# Patient Record
Sex: Male | Born: 1950 | Race: White | Hispanic: No | Marital: Married | Smoking: Current every day smoker
Health system: Southern US, Community
[De-identification: ages and names within clinical notes are randomized; demographics above are authoritative.]

## PROBLEM LIST (undated history)

## (undated) DIAGNOSIS — I10 Essential (primary) hypertension: Secondary | ICD-10-CM

## (undated) DIAGNOSIS — E785 Hyperlipidemia, unspecified: Secondary | ICD-10-CM

## (undated) DIAGNOSIS — E119 Type 2 diabetes mellitus without complications: Secondary | ICD-10-CM

## (undated) DIAGNOSIS — N529 Male erectile dysfunction, unspecified: Secondary | ICD-10-CM

## (undated) HISTORY — DX: Essential (primary) hypertension: I10

## (undated) HISTORY — DX: Hyperlipidemia, unspecified: E78.5

## (undated) HISTORY — PX: CATARACT EXTRACTION, BILATERAL: SHX1313

## (undated) HISTORY — PX: FACIAL RECONSTRUCTION SURGERY: SHX631

## (undated) HISTORY — DX: Type 2 diabetes mellitus without complications: E11.9

## (undated) HISTORY — DX: Male erectile dysfunction, unspecified: N52.9

---

## 2000-05-06 ENCOUNTER — Encounter: Admission: RE | Admit: 2000-05-06 | Discharge: 2000-05-06 | Payer: Self-pay | Admitting: Family Medicine

## 2000-05-06 ENCOUNTER — Encounter: Payer: Self-pay | Admitting: Family Medicine

## 2000-05-14 ENCOUNTER — Encounter: Payer: Self-pay | Admitting: Family Medicine

## 2000-05-14 ENCOUNTER — Encounter: Admission: RE | Admit: 2000-05-14 | Discharge: 2000-05-14 | Payer: Self-pay | Admitting: Family Medicine

## 2013-02-08 ENCOUNTER — Other Ambulatory Visit: Payer: Self-pay | Admitting: Family Medicine

## 2013-02-08 DIAGNOSIS — Z0000 Encounter for general adult medical examination without abnormal findings: Secondary | ICD-10-CM

## 2013-02-11 ENCOUNTER — Ambulatory Visit
Admission: RE | Admit: 2013-02-11 | Discharge: 2013-02-11 | Disposition: A | Discharge status: Home / Self Care | Payer: Medicare Other | Source: Ambulatory Visit | Attending: Family Medicine | Admitting: Family Medicine

## 2013-02-11 DIAGNOSIS — Z0000 Encounter for general adult medical examination without abnormal findings: Secondary | ICD-10-CM

## 2014-03-24 ENCOUNTER — Encounter: Payer: Medicare Other | Attending: Family Medicine

## 2014-03-24 VITALS — Ht 71.0 in | Wt 224.4 lb

## 2014-03-24 DIAGNOSIS — Z713 Dietary counseling and surveillance: Secondary | ICD-10-CM | POA: Insufficient documentation

## 2014-03-24 DIAGNOSIS — E119 Type 2 diabetes mellitus without complications: Secondary | ICD-10-CM | POA: Insufficient documentation

## 2014-03-29 NOTE — Progress Notes (Signed)
Patient was seen on 03/24/14 for the first of a series of three diabetes self-management courses at the [facility].  Patient Education Plan per assessed needs and concerns is to attend four course education program for Diabetes Self Management Education.  The following learning objectives were met by the patient during this class:  Describe diabetes  State some common risk factors for diabetes  Defines the role of glucose and insulin  Identifies type of diabetes and pathophysiology  Describe the relationship between diabetes and cardiovascular risk  State the members of the Healthcare Team  States the rationale for glucose monitoring  State when to test glucose  State their individual Target Range  State the importance of logging glucose readings  Describe how to interpret glucose readings  Identifies A1C target  Explain the correlation between A1c and eAG values  State symptoms and treatment of high blood glucose  State symptoms and treatment of low blood glucose  Explain proper technique for glucose testing  Identifies proper sharps disposal  Handouts given during class include:  Living Well with Diabetes book  Carb Counting and Meal Planning book  Meal Plan Card  Carbohydrate guide  Meal planning worksheet  Low Sodium Flavoring Tips  The diabetes portion plate  V7D to eAG Conversion Chart  Diabetes Medications  Diabetes Recommended Care Schedule  Support Group  Diabetes Success Plan  Core Class Satisfaction Survey  Blood Glucose Monitoring Instruction Meter Provided: Yes  If Yes, Brand:  One Touch Ultra Mini Self Monitoring Kit Lot #: [id number] Expiration Date:11/2014    Intervention:    Explained rationale of testing BG to obtain data as to how their diabetes is being managed.  Provided Target Ranges for both pre and post meals  Explained factors that effect BG including food (carbohydrate), stress,  activity level and insulin availability in the body including diabetes medications  Taught patient techniques for using BG monitor and lancing device  Discussed need for Rx for strips and lancets   Explained rationale of recording BG both for patient and MD to assess patterns as needed.  Follow-Up Plan:  Attend core 2

## 2014-03-31 DIAGNOSIS — E119 Type 2 diabetes mellitus without complications: Secondary | ICD-10-CM | POA: Diagnosis not present

## 2014-03-31 NOTE — Progress Notes (Signed)

## 2014-04-07 DIAGNOSIS — E119 Type 2 diabetes mellitus without complications: Secondary | ICD-10-CM | POA: Diagnosis not present

## 2014-04-07 NOTE — Progress Notes (Signed)
Patient was seen on 04/07/14 for the third of a series of three diabetes self-management courses at the [facility]. The following learning objectives were met by the patient during this class:  . State the amount of activity recommended for healthy living . Describe activities suitable for individual needs . Identify ways to regularly incorporate activity into daily life . Identify barriers to activity and ways to over come these barriers  Identify diabetes medications being personally used and their primary action for lowering glucose and possible side effects . Describe role of stress on blood glucose and develop strategies to address psychosocial issues . Identify diabetes complications and ways to prevent them  Explain how to manage diabetes during illness . Evaluate success in meeting personal goal . Establish 2-3 goals that they will plan to diligently work on until they return for the  21-monthfollow-up visit  Goals:   I will be active 30 minutes or more 5 times a week  I will test my glucose at least 1 times a day, 7 days a week  I will compare my readings weekly  Your patient has identified these potential barriers to change:  Finances  Your patient has identified their diabetes self-care support plan as  [facility] Plan:  Attend Core 4 in 4 months

## 2014-08-10 ENCOUNTER — Encounter: Payer: Medicare Other | Attending: Family Medicine

## 2014-08-10 VITALS — Wt 213.4 lb

## 2014-08-10 DIAGNOSIS — Z713 Dietary counseling and surveillance: Secondary | ICD-10-CM | POA: Diagnosis not present

## 2014-08-10 DIAGNOSIS — E119 Type 2 diabetes mellitus without complications: Secondary | ICD-10-CM | POA: Insufficient documentation

## 2014-08-10 NOTE — Progress Notes (Signed)
Appt start time: 0900 end time:  1000.  Patient was seen on 08/10/2014 for a review of the series of three diabetes self-management courses at the [facility]. The following learning objectives were met by the patient during this class:  . Reviewed blood glucose monitoring and interpretation including the recommended target ranges and Hgb A1c.  . Reviewed on carb counting, importance of regularly scheduled meals/snacks, and meal planning.  . Reviewed the effects of physical activity on glucose levels and long-term glucose control.  Recommended goal of 150 minutes of physical activity/week. . Reviewed patient medications and discussed role of medication on blood glucose and possible side effects. . Discussed strategies to manage stress, psychosocial issues, and other obstacles to diabetes management. . Encouraged moderate weight reduction to improve glucose levels.   . Reviewed short-term complications: hyper- and hypo-glycemia.  Discussed causes, symptoms, and treatment options. . Reviewed prevention, detection, and treatment of long-term complications.  Discussed the role of prolonged elevated glucose levels on body systems.  Goals:  Follow Diabetes Meal Plan as instructed  Eat 3 meals and 2 snacks, every 3-5 hrs  Limit carbohydrate intake to 45 grams carbohydrate/meal Limit carbohydrate intake to 15 grams carbohydrate/snack Add lean protein foods to meals/snacks  Monitor glucose levels as instructed by your doctor  Aim for goal of 15-30 mins of physical activity daily as tolerated  Bring food record and glucose log to your next nutrition visit 

## 2017-06-05 ENCOUNTER — Encounter: Payer: Self-pay | Admitting: Neurology

## 2017-06-10 NOTE — Progress Notes (Signed)
Subjective:   Randy Dominguez was seen in consultation in the movement disorder clinic at the request of Lupita Raider, MD.  This patient is accompanied in the office by his spouse who supplements the history.The evaluation is for tremor.  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   There is a family hx of tremor in his mother and maternal GM.    Affected by caffeine:  No. (drinks 3 cups coffee/day) Affected by alcohol:  No. (drinks 2-3 beers and a mixed drink only on Tuesday night - bowling night) Affected by stress:  Yes.   (worse when angry) Affected by fatigue:  No. Spills soup if on spoon:  No. Spills glass of liquid if full:  No. Affects ADL's (tying shoes, brushing teeth, etc):  No.  Current/Previously tried tremor medications: no  Current medications that may exacerbate tremor:  n/a  Outside reports reviewed: historical medical records, office notes and referral letter/letters.  No prior neuroimaging of the brain has been completed.  No Known Allergies  Outpatient Encounter Medications as of 06/12/2017  Medication Sig  . aspirin 81 MG tablet Take 81 mg by mouth daily.  Marland Kitchen atorvastatin (LIPITOR) 80 MG tablet Take 80 mg by mouth daily.  . dorzolamide-timolol (COSOPT) 22.3-6.8 MG/ML ophthalmic solution Place 1 drop into both eyes 2 (two) times daily.  . folic acid (FOLVITE) 400 MCG tablet Take 400 mcg by mouth daily.  Marland Kitchen levothyroxine (SYNTHROID, LEVOTHROID) 50 MCG tablet Take 50 mcg by mouth daily before breakfast.  . lisinopril-hydrochlorothiazide (PRINZIDE,ZESTORETIC) 20-12.5 MG per tablet Take 1 tablet by mouth daily.  . Omega-3 Fatty Acids (FISH OIL) 1000 MG CAPS Take 1 capsule by mouth 2 (two) times daily.  . tadalafil (CIALIS) 5 MG tablet Take 5 mg by mouth daily as needed for erectile dysfunction.   No facility-administered  encounter medications on file as of 06/12/2017.     Past Medical History:  Diagnosis Date  . Diabetes mellitus without complication (HCC)   . Erectile dysfunction   . Hyperlipidemia   . Hypertension     Past Surgical History:  Procedure Laterality Date  . CATARACT EXTRACTION, BILATERAL    . FACIAL RECONSTRUCTION SURGERY  age 32    Social History   Socioeconomic History  . Marital status: Married    Spouse name: Not on file  . Number of children: Not on file  . Years of education: Not on file  . Highest education level: Not on file  Occupational History  . Not on file  Social Needs  . Financial resource strain: Not on file  . Food insecurity:    Worry: Not on file    Inability: Not on file  . Transportation needs:    Medical: Not on file    Non-medical: Not on file  Tobacco Use  . Smoking status: Current Every Day Smoker    Packs/day: 1.00  . Smokeless tobacco: Never Used  Substance and Sexual Activity  . Alcohol use: Yes    Alcohol/week: 0.0 oz    Comment: couple beers once a week  . Drug use: Never  . Sexual activity: Not on file  Lifestyle  . Physical activity:    Days per week: Not on file    Minutes per session: Not on file  . Stress: Not on file  Relationships  . Social connections:  Talks on phone: Not on file    Gets together: Not on file    Attends religious service: Not on file    Active member of club or organization: Not on file    Attends meetings of clubs or organizations: Not on file    Relationship status: Not on file  . Intimate partner violence:    Fear of current or ex partner: Not on file    Emotionally abused: Not on file    Physically abused: Not on file    Forced sexual activity: Not on file  Other Topics Concern  . Not on file  Social History Narrative  . Not on file    Family Status  Relation Name Status  . Other  (Not Specified)  . Mother  Alive  . Father  Deceased  . Brother  Alive  . Brother  Alive  . Daughter   Alive  . Daughter  Alive    Review of Systems A complete 10 system ROS was obtained and was negative apart from what is mentioned.   Objective:   VITALS:   Vitals:   06/12/17 0835  BP: 134/80  Pulse: 78  SpO2: 92%  Weight: 214 lb (97.1 kg)  Height:  (1.803 m)   Gen:  Appears stated age and in NAD. HEENT:  Normocephalic, atraumatic. The mucous membranes are moist. The superficial temporal arteries are without ropiness or tenderness. Cardiovascular: Regular rate and rhythm. Lungs: Clear to auscultation bilaterally. Neck: There are no carotid bruits noted bilaterally.  NEUROLOGICAL:  Orientation:  The patient is alert and oriented x 3.  Recent and remote memory are intact.  Attention span and concentration are normal.  Able to name objects and repeat without trouble.  Fund of knowledge is appropriate Cranial nerves: There is good facial symmetry. The pupils are equal round and reactive to light bilaterally. Fundoscopic exam reveals clear disc margins bilaterally. Extraocular muscles are intact and visual fields are full to confrontational testing. Speech is fluent and clear. Soft palate rises symmetrically and there is no tongue deviation. Hearing is intact to conversational tone. Tone: Tone is good throughout. Sensation: Sensation is intact to light touch and pinprick throughout (facial, trunk, extremities). Vibration is intact at the bilateral big toe. There is no extinction with double simultaneous stimulation. There is no sensory dermatomal level identified. Coordination:  The patient has no dysdiadichokinesia or dysmetria. Motor: Strength is 5/5 in the bilateral upper and lower extremities.  Shoulder shrug is equal bilaterally.  There is no pronator drift.  There are no fasciculations noted. DTR's: Deep tendon reflexes are 2-/4 at the bilateral biceps, triceps, brachioradialis, patella and achilles.  Plantar responses are downgoing bilaterally. Gait and Station: The patient  is able to ambulate without difficulty. The patient is able to heel toe walk without any difficulty. The patient is able to ambulate in a tandem fashion. The patient is able to stand in the Romberg position.   MOVEMENT EXAM: Tremor:  There is tremor in the UE, noted most significantly with action.  The right is more significant than the left.  Tremor is noted with Archimedes spirals bilaterally.  The patient can pour water from one glass to another, but he does spill it and tremor is evident.  He does have a mild degree of rest tremor in both hands when significantly distracted.     Labs: Reviewed the patient's lab work from Jun 04, 2016.  Sodium was 141, potassium 3.8, chloride 103, CO2 27, BUN 20  and creatinine 0.96.  TSH was 4.05.  Hemoglobin A1c 6.7.  AST 19, ALT 16, alkaline phosphatase 84.   Assessment/Plan:   1.  Essential Tremor.  -This is evidenced by the symmetrical nature and longstanding hx of gradually getting worse.  We discussed nature and pathophysiology.  We discussed that this can continue to gradually get worse with time.  We discussed that some medications can worsen this, as can caffeine use.  We discussed that patients can have rest tremor (as he does) after having essential tremor for a number of years.  We discussed medication therapy as well as surgical therapy.  He was shown HIPAA compliant videos of patients who have had surgery and he asked many questions about this.  Ultimately, the patient decided to hold off on doing anything, as he is not that bothered by it..    2.  Tobacco abuse  -Currently smoking 1.0 packs/day    - Patient was informed of the dangers of tobacco abuse including stroke, cancer, and MI, as well as benefits of tobacco cessation.  - Patient  willing to quit at this time.  Has a RX for chantix but hasn't picked it up.  Encouraged him to do so.  - Approximately 3 mins were spent counseling patient cessation techniques. We discussed various methods to  help quit smoking, including deciding on a date to quit, joining a support group, pharmacological agents- nicotine gum/patch/lozenges, chantix,   - I will reassess his progress at future visit.  3.  F/u 1 year, sooner should new neuro issues arise.  Much greater than 50% of this visit was spent in counseling and coordinating care.  Total face to face time:  45 min CC:  Lupita Raider, MD

## 2017-06-12 ENCOUNTER — Encounter: Payer: Self-pay | Admitting: Neurology

## 2017-06-12 ENCOUNTER — Ambulatory Visit: Payer: Medicare Other | Admitting: Neurology

## 2017-06-12 VITALS — BP 134/80 | HR 78 | Ht 71.0 in | Wt 214.0 lb

## 2017-06-12 DIAGNOSIS — F17210 Nicotine dependence, cigarettes, uncomplicated: Secondary | ICD-10-CM

## 2017-06-12 DIAGNOSIS — G250 Essential tremor: Secondary | ICD-10-CM

## 2017-06-12 DIAGNOSIS — Z720 Tobacco use: Secondary | ICD-10-CM

## 2018-06-15 ENCOUNTER — Ambulatory Visit: Payer: Medicare Other | Admitting: Neurology

## 2018-07-02 NOTE — Progress Notes (Signed)
Subjective:   Randy Dominguez was seen in follow-up for essential tremor.  He has not wanted any medications for this, but did want to follow here.  I have not seen him in about a year.  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    He has not had any falls.  He denies any new medical problems.    No Known Allergies  Outpatient Encounter Medications as of 07/06/2018  Medication Sig  . aspirin 81 MG tablet Take 81 mg by mouth daily.  Marland Kitchen. atorvastatin (LIPITOR) 80 MG tablet Take 80 mg by mouth daily.  . dorzolamide-timolol (COSOPT) 22.3-6.8 MG/ML ophthalmic solution Place 1 drop into both eyes 2 (two) times daily.  . folic acid (FOLVITE) 400 MCG tablet Take 400 mcg by mouth daily.  Marland Kitchen. levothyroxine (SYNTHROID, LEVOTHROID) 50 MCG tablet Take 50 mcg by mouth daily before breakfast.  . lisinopril-hydrochlorothiazide (PRINZIDE,ZESTORETIC) 20-12.5 MG per tablet Take 1 tablet by mouth daily.  . Omega-3 Fatty Acids (FISH OIL) 1000 MG CAPS Take 1 capsule by mouth 2 (two) times daily.  . tadalafil (CIALIS) 5 MG tablet Take 5 mg by mouth daily as needed for erectile dysfunction.   No facility-administered encounter medications on file as of 07/06/2018.     Past Medical History:  Diagnosis Date  . Diabetes mellitus without complication (HCC)   . Erectile dysfunction   . Hyperlipidemia   . Hypertension     Past Surgical History:  Procedure Laterality Date  . CATARACT EXTRACTION, BILATERAL    . FACIAL RECONSTRUCTION SURGERY  age 89    Social History   Socioeconomic History  . Marital status: Married    Spouse name: Not on file  . Number of children: Not on file  . Years of education: Not on file  . Highest education level: Not on file  Occupational History  . Not on file  Social Needs  . Financial resource strain: Not on file  . Food insecurity    Worry: Not on  file    Inability: Not on file  . Transportation needs    Medical: Not on file    Non-medical: Not on file  Tobacco Use  . Smoking status: Current Every Day Smoker    Packs/day: 1.00  . Smokeless tobacco: Never Used  Substance and Sexual Activity  . Alcohol use: Yes    Alcohol/week: 0.0 standard drinks    Comment: couple beers once a week  . Drug use: Never  . Sexual activity: Not on file  Lifestyle  . Physical activity    Days per week: Not on file    Minutes per session: Not on file  . Stress: Not on file  Relationships  . Social Musicianconnections    Talks on phone: Not on file    Gets together: Not on file    Attends religious service: Not on file    Active member of club or organization: Not on file    Attends meetings of clubs or organizations: Not on file    Relationship status: Not on file  . Intimate partner violence    Fear of current or ex partner: Not on file    Emotionally abused: Not on file    Physically abused: Not on file    Forced sexual activity: Not on file  Other Topics Concern  . Not  on file  Social History Narrative  . Not on file    Family Status  Relation Name Status  . Other  (Not Specified)  . Mother  Alive  . Father  Deceased  . Brother  Alive  . Brother  Alive  . Daughter  Alive  . Daughter  Alive    Review of Systems Review of Systems  Constitutional: Negative.   HENT: Negative.   Eyes: Negative.   Cardiovascular: Negative.   Gastrointestinal: Negative.   Genitourinary: Negative.   Skin: Negative.       Objective:   VITALS:   Vitals:   07/06/18 0835  BP: (!) 142/72  Pulse: 64  Temp: 97.9 F (36.6 C)  SpO2: 97%  Weight: 211 lb 12.8 oz (96.1 kg)  Height: 5\' 10"  (1.778 m)   GEN:  The patient appears stated age and is in NAD. HEENT:  Normocephalic, atraumatic.  The mucous membranes are moist. The superficial temporal arteries are without ropiness or tenderness. CV:  RRR Lungs:  There are diffuse expiratory wheezes  Neck/HEME:  There are no carotid bruits bilaterally.  Neurological examination:  Orientation: The patient is alert and oriented x3. Cranial nerves: There is good facial symmetry. The speech is fluent and clear. Soft palate rises symmetrically and there is no tongue deviation. Hearing is intact to conversational tone. Sensation: Sensation is intact to light touch throughout Motor: Strength is 5/5 in the bilateral upper and lower extremities.   Shoulder shrug is equal and symmetric.  There is no pronator drift.  Movement examination: Tone: There is normal tone in the UE/LE Abnormal movements: There is postural tremor.  The right is more significant than the left.  He has a very mild degree of rest tremor in both hands with distraction. Coordination:  There is no decremation with RAM's, with any form of RAMS, including alternating supination and pronation of the forearm, hand opening and closing, finger taps, heel taps and toe taps. Gait and Station: The patient has no difficulty arising out of a deep-seated chair without the use of the hands. The patient's stride length is good.       Labs: Reviewed the patient's lab work from Jun 12, 2018.  Hemoglobin A1c was 6.5.  Sodium was 140, potassium 3.7, chloride 104, CO2 27, BUN 16, creatinine 0.98, glucose 92.  AST was 15, ALT 13.  TSH was 3.69.  Total cholesterol was 160.   Assessment/Plan:   1.  Essential Tremor.  -This is evidenced by the symmetrical nature and longstanding hx of gradually getting worse.  We discussed nature and pathophysiology.  We discussed that this can continue to gradually get worse with time.  We discussed that some medications can worsen this, as can caffeine use.  We discussed that patients can have rest tremor (as he does) after having essential tremor for a number of years.  We discussed medication therapy as well as surgical therapy.  He decided to try primidone, but I do think that he is trying to satisfy his wife's  desire for tremor control more than his own (he said this).  Will start with 50 mg nightly.  Risks, benefits, side effects and alternative therapies were discussed.  The opportunity to ask questions was given and they were answered to the best of my ability.  The patient expressed understanding and willingness to follow the outlined treatment protocols.  2.  Tobacco abuse  -Currently smoking 1 packs/day    - Patient was informed of the dangers  of tobacco abuse including stroke, cancer, and MI, as well as benefits of tobacco cessation.  - Patient not willing to quit at this time.  - Approximately 3 mins were spent counseling patient cessation techniques. We discussed various methods to help quit smoking, including deciding on a date to quit, joining a support group, pharmacological agents- nicotine gum/patch/lozenges, chantix,   - I will reassess his progress at future visit.   3.  F/u 6 months, sooner should new neuro issues arise.  Much greater than 50% of this visit was spent in counseling and coordinating care.  Total face to face time:  25 min, not including tobacco counseling time  CC:  Mayra Neer, MD

## 2018-07-06 ENCOUNTER — Other Ambulatory Visit: Payer: Self-pay

## 2018-07-06 ENCOUNTER — Ambulatory Visit (INDEPENDENT_AMBULATORY_CARE_PROVIDER_SITE_OTHER): Payer: Medicare Other | Admitting: Neurology

## 2018-07-06 ENCOUNTER — Encounter: Payer: Self-pay | Admitting: Neurology

## 2018-07-06 VITALS — BP 142/72 | HR 64 | Temp 97.9°F | Ht 70.0 in | Wt 211.8 lb

## 2018-07-06 DIAGNOSIS — F17210 Nicotine dependence, cigarettes, uncomplicated: Secondary | ICD-10-CM | POA: Diagnosis not present

## 2018-07-06 DIAGNOSIS — G250 Essential tremor: Secondary | ICD-10-CM

## 2018-07-06 DIAGNOSIS — Z720 Tobacco use: Secondary | ICD-10-CM

## 2018-07-06 MED ORDER — PRIMIDONE 50 MG PO TABS: 50.0000 mg | ORAL_TABLET | Freq: Every day | ORAL | 1 refills | Status: AC

## 2018-07-06 NOTE — Patient Instructions (Addendum)
Take primidone - 50 mg - 1/2 tablet at night for four nights and then increase to 1 tablet at night thereafter.  The physicians and staff at [facility] are committed to providing excellent care. You may receive a survey requesting feedback about your experience at our office. We strive to receive "very good" responses to the survey questions. If you feel that your experience would prevent you from giving the office a "very good " response, please contact our office to try to remedy the situation. We may be reached at [phone]. Thank you for taking the time out of your busy day to complete the survey.

## 2019-02-27 ENCOUNTER — Ambulatory Visit: Payer: Medicare Other | Attending: Internal Medicine

## 2019-02-27 DIAGNOSIS — Z23 Encounter for immunization: Secondary | ICD-10-CM | POA: Insufficient documentation

## 2019-02-27 NOTE — Progress Notes (Signed)
   Covid-19 Vaccination Clinic  Name:  Randy Dominguez    MRN: [id number] DOB: March 06, 1950  02/27/2019  Randy Dominguez was observed post Covid-19 immunization for  15 minutes without incidence. He was provided with Vaccine Information Sheet and instruction to access the V-Safe system.   Randy Dominguez was instructed to call 911 with any severe reactions post vaccine: Marland Kitchen Difficulty breathing  . Swelling of your face and throat  . A fast heartbeat  . A bad rash all over your body  . Dizziness and weakness    Immunizations Administered    Name Date Dose VIS Date Route   Pfizer COVID-19 Vaccine 02/27/2019  8:31 AM 0.3 mL 12/25/2018 Intramuscular   Manufacturer: [employer], [employer]   Lot: [id number]   NDC: [address]-3

## 2019-03-21 ENCOUNTER — Ambulatory Visit: Payer: Medicare Other | Attending: Internal Medicine

## 2019-03-21 DIAGNOSIS — Z23 Encounter for immunization: Secondary | ICD-10-CM | POA: Insufficient documentation

## 2019-03-21 NOTE — Progress Notes (Signed)
   Covid-19 Vaccination Clinic  Name:  Randy Dominguez    MRN: [id number] DOB: [birth year]  03/21/2019  [name] was observed post Covid-19 immunization for 15 minutes without incident. He was provided with Vaccine Information Sheet and instruction to access the V-Safe system.   [name] was instructed to call 911 with any severe reactions post vaccine: Marland Kitchen Difficulty breathing  . Swelling of face and throat  . A fast heartbeat  . A bad rash all over body  . Dizziness and weakness   Immunizations Administered    Name Date Dose VIS Date Route   Pfizer COVID-19 Vaccine 03/21/2019  8:19 AM 0.3 mL 12/25/2018 Intramuscular   Manufacturer: [employer], [employer]   Lot: [id number]   NDC: [address]-7

## 2019-11-13 ENCOUNTER — Other Ambulatory Visit: Payer: Self-pay

## 2019-11-13 ENCOUNTER — Ambulatory Visit: Payer: Medicare Other | Attending: Internal Medicine

## 2019-11-13 DIAGNOSIS — Z23 Encounter for immunization: Secondary | ICD-10-CM

## 2019-11-13 NOTE — Progress Notes (Signed)
   Covid-19 Vaccination Clinic  Name:  Randy Dominguez    MRN: [id number] DOB: Oct 30, 1950  11/13/2019  Mr. Randy Dominguez was observed post Covid-19 immunization for 15 minutes without incident. He was provided with Vaccine Information Sheet and instruction to access the V-Safe system.   Mr. Randy Dominguez was instructed to call 911 with any severe reactions post vaccine: Marland Kitchen Difficulty breathing  . Swelling of face and throat  . A fast heartbeat  . A bad rash all over body  . Dizziness and weakness

## 2020-01-24 DIAGNOSIS — Z1152 Encounter for screening for COVID-19: Secondary | ICD-10-CM | POA: Diagnosis not present

## 2020-01-26 DIAGNOSIS — Z20822 Contact with and (suspected) exposure to covid-19: Secondary | ICD-10-CM | POA: Diagnosis not present

## 2020-02-16 DIAGNOSIS — H40013 Open angle with borderline findings, low risk, bilateral: Secondary | ICD-10-CM | POA: Diagnosis not present

## 2020-04-10 DIAGNOSIS — G5603 Carpal tunnel syndrome, bilateral upper limbs: Secondary | ICD-10-CM | POA: Diagnosis not present

## 2020-06-02 DIAGNOSIS — G5601 Carpal tunnel syndrome, right upper limb: Secondary | ICD-10-CM | POA: Diagnosis not present

## 2020-06-02 DIAGNOSIS — M62531 Muscle wasting and atrophy, not elsewhere classified, right forearm: Secondary | ICD-10-CM | POA: Diagnosis not present

## 2020-06-02 DIAGNOSIS — G8918 Other acute postprocedural pain: Secondary | ICD-10-CM | POA: Diagnosis not present

## 2020-06-15 DIAGNOSIS — G5601 Carpal tunnel syndrome, right upper limb: Secondary | ICD-10-CM | POA: Diagnosis not present

## 2020-07-06 DIAGNOSIS — M25641 Stiffness of right hand, not elsewhere classified: Secondary | ICD-10-CM | POA: Diagnosis not present

## 2020-07-06 DIAGNOSIS — M25631 Stiffness of right wrist, not elsewhere classified: Secondary | ICD-10-CM | POA: Diagnosis not present

## 2020-07-10 DIAGNOSIS — Z0000 Encounter for general adult medical examination without abnormal findings: Secondary | ICD-10-CM | POA: Diagnosis not present

## 2020-07-10 DIAGNOSIS — Z86010 Personal history of colonic polyps: Secondary | ICD-10-CM | POA: Diagnosis not present

## 2020-07-10 DIAGNOSIS — Z23 Encounter for immunization: Secondary | ICD-10-CM | POA: Diagnosis not present

## 2020-07-10 DIAGNOSIS — E039 Hypothyroidism, unspecified: Secondary | ICD-10-CM | POA: Diagnosis not present

## 2020-07-10 DIAGNOSIS — E782 Mixed hyperlipidemia: Secondary | ICD-10-CM | POA: Diagnosis not present

## 2020-07-10 DIAGNOSIS — F17200 Nicotine dependence, unspecified, uncomplicated: Secondary | ICD-10-CM | POA: Diagnosis not present

## 2020-07-10 DIAGNOSIS — I10 Essential (primary) hypertension: Secondary | ICD-10-CM | POA: Diagnosis not present

## 2020-07-10 DIAGNOSIS — E1169 Type 2 diabetes mellitus with other specified complication: Secondary | ICD-10-CM | POA: Diagnosis not present

## 2020-07-11 DIAGNOSIS — H40013 Open angle with borderline findings, low risk, bilateral: Secondary | ICD-10-CM | POA: Diagnosis not present

## 2020-07-11 DIAGNOSIS — H26491 Other secondary cataract, right eye: Secondary | ICD-10-CM | POA: Diagnosis not present

## 2020-07-11 DIAGNOSIS — H353131 Nonexudative age-related macular degeneration, bilateral, early dry stage: Secondary | ICD-10-CM | POA: Diagnosis not present

## 2020-07-11 DIAGNOSIS — E119 Type 2 diabetes mellitus without complications: Secondary | ICD-10-CM | POA: Diagnosis not present

## 2020-07-14 DIAGNOSIS — M25641 Stiffness of right hand, not elsewhere classified: Secondary | ICD-10-CM | POA: Diagnosis not present

## 2020-07-14 DIAGNOSIS — M25631 Stiffness of right wrist, not elsewhere classified: Secondary | ICD-10-CM | POA: Diagnosis not present

## 2020-07-21 DIAGNOSIS — M25641 Stiffness of right hand, not elsewhere classified: Secondary | ICD-10-CM | POA: Diagnosis not present

## 2020-10-28 DIAGNOSIS — Z23 Encounter for immunization: Secondary | ICD-10-CM | POA: Diagnosis not present

## 2020-12-14 DIAGNOSIS — G5603 Carpal tunnel syndrome, bilateral upper limbs: Secondary | ICD-10-CM | POA: Diagnosis not present

## 2020-12-14 DIAGNOSIS — M79642 Pain in left hand: Secondary | ICD-10-CM | POA: Diagnosis not present

## 2020-12-14 DIAGNOSIS — M79641 Pain in right hand: Secondary | ICD-10-CM | POA: Diagnosis not present

## 2020-12-27 DIAGNOSIS — H40023 Open angle with borderline findings, high risk, bilateral: Secondary | ICD-10-CM | POA: Diagnosis not present

## 2021-01-16 DIAGNOSIS — E039 Hypothyroidism, unspecified: Secondary | ICD-10-CM | POA: Diagnosis not present

## 2021-01-16 DIAGNOSIS — I10 Essential (primary) hypertension: Secondary | ICD-10-CM | POA: Diagnosis not present

## 2021-01-16 DIAGNOSIS — E782 Mixed hyperlipidemia: Secondary | ICD-10-CM | POA: Diagnosis not present

## 2021-01-16 DIAGNOSIS — F17200 Nicotine dependence, unspecified, uncomplicated: Secondary | ICD-10-CM | POA: Diagnosis not present

## 2021-01-16 DIAGNOSIS — E1169 Type 2 diabetes mellitus with other specified complication: Secondary | ICD-10-CM | POA: Diagnosis not present

## 2021-02-13 ENCOUNTER — Other Ambulatory Visit: Payer: Self-pay

## 2021-02-13 DIAGNOSIS — Z87891 Personal history of nicotine dependence: Secondary | ICD-10-CM

## 2021-02-13 DIAGNOSIS — F17210 Nicotine dependence, cigarettes, uncomplicated: Secondary | ICD-10-CM

## 2021-02-21 ENCOUNTER — Ambulatory Visit (INDEPENDENT_AMBULATORY_CARE_PROVIDER_SITE_OTHER): Payer: Medicare Other | Admitting: Acute Care

## 2021-02-21 ENCOUNTER — Encounter: Payer: Self-pay | Admitting: Acute Care

## 2021-02-21 ENCOUNTER — Other Ambulatory Visit: Payer: Self-pay

## 2021-02-21 DIAGNOSIS — F17210 Nicotine dependence, cigarettes, uncomplicated: Secondary | ICD-10-CM

## 2021-02-21 NOTE — Patient Instructions (Signed)
Thank you for participating in the [facility]. °It was our pleasure to meet you today. °We will call you with the results of your scan within the next few days. °Your scan will be assigned a Lung RADS category score by the physicians reading the scans.  °This Lung RADS score determines follow up scanning.  °See below for description of categories, and follow up screening recommendations. °We will be in touch to schedule your follow up screening annually or based on recommendations of our providers. °We will fax a copy of your scan results to your Primary Care Physician, or the physician who referred you to the program, to ensure they have the results. °Please call the office if you have any questions or concerns regarding your scanning experience or results.  °Our office number is [phone]. °Please speak with Denise Phelps, RN. She is our Lung Cancer Screening RN. °If she is unavailable when you call, please have the office staff send her a message. She will return your call at her earliest convenience. °Remember, if your scan is normal, we will scan you annually as long as you continue to meet the criteria for the program. (Age 55-77, Current smoker or smoker who has quit within the last 15 years). °If you are a smoker, remember, quitting is the single most powerful action that you can take to decrease your risk of lung cancer and other pulmonary, breathing related problems. °We know quitting is hard, and we are here to help.  °Please let us know if there is anything we can do to help you meet your goal of quitting. °If you are a former smoker, congratulations. We are proud of you! Remain smoke free! °Remember you can refer friends or family members through the number above.  °We will screen them to make sure they meet criteria for the program. °Thank you for helping us take better care of you by participating in Lung Screening. ° °You can receive free nicotine replacement therapy  ( patches, gum or mints) by calling 1-800-QUIT NOW. Please call so we can get you on the path to becoming  a non-smoker. I know it is hard, but you can do this! ° °Lung RADS Categories: ° °Lung RADS 1: no nodules or definitely non-concerning nodules.  °Recommendation is for a repeat annual scan in 12 months. ° °Lung RADS 2:  nodules that are non-concerning in appearance and behavior with a very low likelihood of becoming an active cancer. °Recommendation is for a repeat annual scan in 12 months. ° °Lung RADS 3: nodules that are probably non-concerning , includes nodules with a low likelihood of becoming an active cancer.  Recommendation is for a 6-month repeat screening scan. Often noted after an upper respiratory illness. We will be in touch to make sure you have no questions, and to schedule your 6-month scan. ° °Lung RADS 4 A: nodules with concerning findings, recommendation is most often for a follow up scan in 3 months or additional testing based on our provider's assessment of the scan. We will be in touch to make sure you have no questions and to schedule the recommended 3 month follow up scan. ° °Lung RADS 4 B:  indicates findings that are concerning. We will be in touch with you to schedule additional diagnostic testing based on our provider's  assessment of the scan. ° °Hypnosis for smoking cessation  °[employer] °[phone] ° °Acupuncture for smoking cessation  °[facility] °[phone]  °

## 2021-02-21 NOTE — Progress Notes (Signed)
Virtual Visit via Telephone Note  I connected with Randy Dominguez on 11/28/20 at  2:00 PM EST by telephone and verified that I am speaking with the correct person using two identifiers.  Location: Patient: Home Provider: Working from work    I discussed the limitations, risks, security and privacy concerns of performing an evaluation and management service by telephone and the availability of in person appointments. I also discussed with the patient that there may be a patient responsible charge related to this service. The patient expressed understanding and agreed to proceed.  Shared Decision Making Visit Lung Cancer Screening Program [phone])   Eligibility: Age 71 y.o. Pack Years Smoking History Calculation 72 (# packs/per year x # years smoked) Recent History of coughing up blood  no Unexplained weight loss? no ( >Than 15 pounds within the last 6 months ) Prior History Lung / other cancer no (Diagnosis within the last 5 years already requiring surveillance chest CT Scans). Smoking Status Current Smoker Former Smokers: Years since quit: NA  Quit Date: NA  Visit Components: Discussion included one or more decision making aids. yes Discussion included risk/benefits of screening. yes Discussion included potential follow up diagnostic testing for abnormal scans. yes Discussion included meaning and risk of over diagnosis. yes Discussion included meaning and risk of False Positives. yes Discussion included meaning of total radiation exposure. yes  Counseling Included: Importance of adherence to annual lung cancer LDCT screening. yes Impact of comorbidities on ability to participate in the program. yes Ability and willingness to under diagnostic treatment. yes  Smoking Cessation Counseling: Current Smokers:  Discussed importance of smoking cessation. yes Information about tobacco cessation classes and interventions provided to patient. yes Patient provided with "ticket" for  LDCT Scan. yes Symptomatic Patient. yes  Counseling(Intermediate counseling: > three minutes) 99406 Diagnosis Code: Tobacco Use Z72.0 Asymptomatic Patient NA  Counseling NA Former Smokers:  Discussed the importance of maintaining cigarette abstinence. yes Diagnosis Code: Personal History of Nicotine Dependence. U04.540 Information about tobacco cessation classes and interventions provided to patient. Yes Patient provided with "ticket" for LDCT Scan. yes Written Order for Lung Cancer Screening with LDCT placed in Epic. Yes (CT Chest Lung Cancer Screening Low Dose W/O CM) [id number] Z12.2-Screening of respiratory organs Z87.891-Personal history of nicotine dependence   I spent 25 minutes of face to face time with him discussing the risks and benefits of lung cancer screening. We viewed a power point together that explained in detail the above noted topics. We took the time to pause the power point at intervals to allow for questions to be asked and answered to ensure understanding. We discussed that he had taken the single most powerful action possible to decrease his risk of developing lung cancer when he quit smoking. I counseled him to remain smoke free, and to contact me if he ever had the desire to smoke again so that I can provide resources and tools to help support the effort to remain smoke free. We discussed the time and location of the scan, and that either  Randy Miyamoto RN or I will call with the results within  24-48 hours of receiving them. He has my card and contact information in the event he needs to speak with me, in addition to a copy of the power point we reviewed as a resource. He verbalized understanding of all of the above and had no further questions upon leaving the office.     I explained to the patient that there has been  a high incidence of coronary artery disease noted on these exams. I explained that this is a non-gated exam therefore degree or severity cannot be  determined. This patient is on statin therapy. I have asked the patient to follow-up with their PCP regarding any incidental finding of coronary artery disease and management with diet or medication as they feel is clinically indicated. The patient verbalized understanding of the above and had no further questions.  I spent 3 minutes counseling on smoking cessation and the health risks of continued tobacco abuse   Amity Roes D. Tiburcio Pea, NP-C [facility] Personal contact information can be found on Amion  02/21/2021, 10:00 AM

## 2021-02-22 ENCOUNTER — Ambulatory Visit (HOSPITAL_BASED_OUTPATIENT_CLINIC_OR_DEPARTMENT_OTHER)
Admission: RE | Admit: 2021-02-22 | Discharge: 2021-02-22 | Disposition: A | Discharge status: Home / Self Care | Payer: Medicare Other | Source: Ambulatory Visit | Attending: Acute Care | Admitting: Acute Care

## 2021-02-22 ENCOUNTER — Other Ambulatory Visit: Payer: Self-pay

## 2021-02-22 DIAGNOSIS — F17210 Nicotine dependence, cigarettes, uncomplicated: Secondary | ICD-10-CM | POA: Diagnosis not present

## 2021-02-22 DIAGNOSIS — Z87891 Personal history of nicotine dependence: Secondary | ICD-10-CM | POA: Insufficient documentation

## 2021-02-23 ENCOUNTER — Other Ambulatory Visit: Payer: Self-pay | Admitting: Acute Care

## 2021-02-23 DIAGNOSIS — F17210 Nicotine dependence, cigarettes, uncomplicated: Secondary | ICD-10-CM

## 2021-02-23 DIAGNOSIS — Z87891 Personal history of nicotine dependence: Secondary | ICD-10-CM

## 2021-03-21 ENCOUNTER — Encounter: Payer: Medicare Other | Admitting: Acute Care

## 2021-06-27 DIAGNOSIS — H26491 Other secondary cataract, right eye: Secondary | ICD-10-CM | POA: Diagnosis not present

## 2021-06-27 DIAGNOSIS — E119 Type 2 diabetes mellitus without complications: Secondary | ICD-10-CM | POA: Diagnosis not present

## 2021-06-27 DIAGNOSIS — H40023 Open angle with borderline findings, high risk, bilateral: Secondary | ICD-10-CM | POA: Diagnosis not present

## 2021-06-27 DIAGNOSIS — H353132 Nonexudative age-related macular degeneration, bilateral, intermediate dry stage: Secondary | ICD-10-CM | POA: Diagnosis not present

## 2021-07-13 DIAGNOSIS — E782 Mixed hyperlipidemia: Secondary | ICD-10-CM | POA: Diagnosis not present

## 2021-07-13 DIAGNOSIS — Z23 Encounter for immunization: Secondary | ICD-10-CM | POA: Diagnosis not present

## 2021-07-13 DIAGNOSIS — I10 Essential (primary) hypertension: Secondary | ICD-10-CM | POA: Diagnosis not present

## 2021-07-13 DIAGNOSIS — Z86010 Personal history of colonic polyps: Secondary | ICD-10-CM | POA: Diagnosis not present

## 2021-07-13 DIAGNOSIS — Z0000 Encounter for general adult medical examination without abnormal findings: Secondary | ICD-10-CM | POA: Diagnosis not present

## 2021-07-13 DIAGNOSIS — E1169 Type 2 diabetes mellitus with other specified complication: Secondary | ICD-10-CM | POA: Diagnosis not present

## 2021-07-13 DIAGNOSIS — E039 Hypothyroidism, unspecified: Secondary | ICD-10-CM | POA: Diagnosis not present

## 2021-07-13 DIAGNOSIS — F17200 Nicotine dependence, unspecified, uncomplicated: Secondary | ICD-10-CM | POA: Diagnosis not present

## 2021-10-13 DIAGNOSIS — Z23 Encounter for immunization: Secondary | ICD-10-CM | POA: Diagnosis not present

## 2021-12-25 DIAGNOSIS — H40023 Open angle with borderline findings, high risk, bilateral: Secondary | ICD-10-CM | POA: Diagnosis not present

## 2022-01-01 DIAGNOSIS — E039 Hypothyroidism, unspecified: Secondary | ICD-10-CM | POA: Diagnosis not present

## 2022-01-01 DIAGNOSIS — E782 Mixed hyperlipidemia: Secondary | ICD-10-CM | POA: Diagnosis not present

## 2022-01-01 DIAGNOSIS — E1169 Type 2 diabetes mellitus with other specified complication: Secondary | ICD-10-CM | POA: Diagnosis not present

## 2022-01-01 DIAGNOSIS — F17200 Nicotine dependence, unspecified, uncomplicated: Secondary | ICD-10-CM | POA: Diagnosis not present

## 2022-01-01 DIAGNOSIS — I10 Essential (primary) hypertension: Secondary | ICD-10-CM | POA: Diagnosis not present

## 2022-01-30 ENCOUNTER — Ambulatory Visit (HOSPITAL_BASED_OUTPATIENT_CLINIC_OR_DEPARTMENT_OTHER): Admission: RE | Admit: 2022-01-30 | Payer: Medicare Other | Source: Ambulatory Visit

## 2022-02-14 ENCOUNTER — Other Ambulatory Visit: Payer: Self-pay | Admitting: Acute Care

## 2022-02-14 DIAGNOSIS — F17210 Nicotine dependence, cigarettes, uncomplicated: Secondary | ICD-10-CM

## 2022-02-14 DIAGNOSIS — Z87891 Personal history of nicotine dependence: Secondary | ICD-10-CM

## 2022-02-15 DIAGNOSIS — E039 Hypothyroidism, unspecified: Secondary | ICD-10-CM | POA: Diagnosis not present

## 2022-02-24 ENCOUNTER — Ambulatory Visit (HOSPITAL_BASED_OUTPATIENT_CLINIC_OR_DEPARTMENT_OTHER)
Admission: RE | Admit: 2022-02-24 | Discharge: 2022-02-24 | Disposition: A | Discharge status: Home / Self Care | Payer: Medicare Other | Source: Ambulatory Visit | Attending: Acute Care | Admitting: Acute Care

## 2022-02-24 DIAGNOSIS — Z87891 Personal history of nicotine dependence: Secondary | ICD-10-CM | POA: Diagnosis not present

## 2022-02-24 DIAGNOSIS — F17210 Nicotine dependence, cigarettes, uncomplicated: Secondary | ICD-10-CM | POA: Diagnosis not present

## 2022-02-25 ENCOUNTER — Other Ambulatory Visit: Payer: Self-pay | Admitting: Acute Care

## 2022-02-25 DIAGNOSIS — Z122 Encounter for screening for malignant neoplasm of respiratory organs: Secondary | ICD-10-CM

## 2022-02-25 DIAGNOSIS — Z87891 Personal history of nicotine dependence: Secondary | ICD-10-CM

## 2022-02-25 DIAGNOSIS — F17210 Nicotine dependence, cigarettes, uncomplicated: Secondary | ICD-10-CM

## 2022-02-26 ENCOUNTER — Ambulatory Visit (HOSPITAL_BASED_OUTPATIENT_CLINIC_OR_DEPARTMENT_OTHER): Payer: Medicare Other

## 2022-03-22 DIAGNOSIS — E039 Hypothyroidism, unspecified: Secondary | ICD-10-CM | POA: Diagnosis not present

## 2022-05-06 DIAGNOSIS — E039 Hypothyroidism, unspecified: Secondary | ICD-10-CM | POA: Diagnosis not present

## 2022-06-20 DIAGNOSIS — D125 Benign neoplasm of sigmoid colon: Secondary | ICD-10-CM | POA: Diagnosis not present

## 2022-06-20 DIAGNOSIS — D122 Benign neoplasm of ascending colon: Secondary | ICD-10-CM | POA: Diagnosis not present

## 2022-06-20 DIAGNOSIS — Z09 Encounter for follow-up examination after completed treatment for conditions other than malignant neoplasm: Secondary | ICD-10-CM | POA: Diagnosis not present

## 2022-06-20 DIAGNOSIS — Z86010 Personal history of colonic polyps: Secondary | ICD-10-CM | POA: Diagnosis not present

## 2022-06-20 DIAGNOSIS — K648 Other hemorrhoids: Secondary | ICD-10-CM | POA: Diagnosis not present

## 2022-06-25 DIAGNOSIS — D122 Benign neoplasm of ascending colon: Secondary | ICD-10-CM | POA: Diagnosis not present

## 2022-06-25 DIAGNOSIS — D125 Benign neoplasm of sigmoid colon: Secondary | ICD-10-CM | POA: Diagnosis not present

## 2022-07-19 DIAGNOSIS — Z0000 Encounter for general adult medical examination without abnormal findings: Secondary | ICD-10-CM | POA: Diagnosis not present

## 2022-07-19 DIAGNOSIS — E119 Type 2 diabetes mellitus without complications: Secondary | ICD-10-CM | POA: Diagnosis not present

## 2022-07-19 DIAGNOSIS — Z86010 Personal history of colonic polyps: Secondary | ICD-10-CM | POA: Diagnosis not present

## 2022-07-19 DIAGNOSIS — F17200 Nicotine dependence, unspecified, uncomplicated: Secondary | ICD-10-CM | POA: Diagnosis not present

## 2022-07-19 DIAGNOSIS — E039 Hypothyroidism, unspecified: Secondary | ICD-10-CM | POA: Diagnosis not present

## 2022-07-19 DIAGNOSIS — I10 Essential (primary) hypertension: Secondary | ICD-10-CM | POA: Diagnosis not present

## 2022-07-19 DIAGNOSIS — J439 Emphysema, unspecified: Secondary | ICD-10-CM | POA: Diagnosis not present

## 2022-07-19 DIAGNOSIS — E1169 Type 2 diabetes mellitus with other specified complication: Secondary | ICD-10-CM | POA: Diagnosis not present

## 2022-07-19 DIAGNOSIS — E782 Mixed hyperlipidemia: Secondary | ICD-10-CM | POA: Diagnosis not present

## 2022-07-19 DIAGNOSIS — I700 Atherosclerosis of aorta: Secondary | ICD-10-CM | POA: Diagnosis not present

## 2022-08-20 DIAGNOSIS — E119 Type 2 diabetes mellitus without complications: Secondary | ICD-10-CM | POA: Diagnosis not present

## 2022-08-20 DIAGNOSIS — H26491 Other secondary cataract, right eye: Secondary | ICD-10-CM | POA: Diagnosis not present

## 2022-08-20 DIAGNOSIS — H353132 Nonexudative age-related macular degeneration, bilateral, intermediate dry stage: Secondary | ICD-10-CM | POA: Diagnosis not present

## 2022-08-20 DIAGNOSIS — H40023 Open angle with borderline findings, high risk, bilateral: Secondary | ICD-10-CM | POA: Diagnosis not present

## 2022-09-17 DIAGNOSIS — E039 Hypothyroidism, unspecified: Secondary | ICD-10-CM | POA: Diagnosis not present

## 2022-10-25 DIAGNOSIS — Z23 Encounter for immunization: Secondary | ICD-10-CM | POA: Diagnosis not present

## 2023-01-02 ENCOUNTER — Other Ambulatory Visit: Payer: Self-pay | Admitting: Acute Care

## 2023-01-02 DIAGNOSIS — F17210 Nicotine dependence, cigarettes, uncomplicated: Secondary | ICD-10-CM

## 2023-01-02 DIAGNOSIS — Z87891 Personal history of nicotine dependence: Secondary | ICD-10-CM

## 2023-01-02 DIAGNOSIS — Z122 Encounter for screening for malignant neoplasm of respiratory organs: Secondary | ICD-10-CM

## 2023-01-23 DIAGNOSIS — J439 Emphysema, unspecified: Secondary | ICD-10-CM | POA: Diagnosis not present

## 2023-01-23 DIAGNOSIS — I10 Essential (primary) hypertension: Secondary | ICD-10-CM | POA: Diagnosis not present

## 2023-01-23 DIAGNOSIS — E1169 Type 2 diabetes mellitus with other specified complication: Secondary | ICD-10-CM | POA: Diagnosis not present

## 2023-01-23 DIAGNOSIS — E039 Hypothyroidism, unspecified: Secondary | ICD-10-CM | POA: Diagnosis not present

## 2023-01-23 DIAGNOSIS — E782 Mixed hyperlipidemia: Secondary | ICD-10-CM | POA: Diagnosis not present

## 2023-02-26 ENCOUNTER — Ambulatory Visit (HOSPITAL_BASED_OUTPATIENT_CLINIC_OR_DEPARTMENT_OTHER)
Admission: RE | Admit: 2023-02-26 | Discharge: 2023-02-26 | Disposition: A | Discharge status: Home / Self Care | Payer: Medicare Other | Source: Ambulatory Visit | Attending: Family Medicine | Admitting: Family Medicine

## 2023-02-26 DIAGNOSIS — N179 Acute kidney failure, unspecified: Secondary | ICD-10-CM | POA: Diagnosis not present

## 2023-02-26 DIAGNOSIS — Z122 Encounter for screening for malignant neoplasm of respiratory organs: Secondary | ICD-10-CM | POA: Diagnosis not present

## 2023-02-26 DIAGNOSIS — Z87891 Personal history of nicotine dependence: Secondary | ICD-10-CM | POA: Insufficient documentation

## 2023-02-26 DIAGNOSIS — F17210 Nicotine dependence, cigarettes, uncomplicated: Secondary | ICD-10-CM | POA: Insufficient documentation

## 2023-03-12 DIAGNOSIS — H353132 Nonexudative age-related macular degeneration, bilateral, intermediate dry stage: Secondary | ICD-10-CM | POA: Diagnosis not present

## 2023-03-12 DIAGNOSIS — H40023 Open angle with borderline findings, high risk, bilateral: Secondary | ICD-10-CM | POA: Diagnosis not present

## 2023-03-14 ENCOUNTER — Other Ambulatory Visit: Payer: Self-pay

## 2023-03-14 DIAGNOSIS — F17210 Nicotine dependence, cigarettes, uncomplicated: Secondary | ICD-10-CM

## 2023-03-14 DIAGNOSIS — Z87891 Personal history of nicotine dependence: Secondary | ICD-10-CM

## 2023-03-14 DIAGNOSIS — I10 Essential (primary) hypertension: Secondary | ICD-10-CM | POA: Diagnosis not present

## 2023-03-14 DIAGNOSIS — Z122 Encounter for screening for malignant neoplasm of respiratory organs: Secondary | ICD-10-CM

## 2023-07-04 IMAGING — CT CT CHEST LUNG CANCER SCREENING LOW DOSE W/O CM
1 series · 10 of 10 positions shown, 13 images · non-contrast
Comparison: None.

CLINICAL DATA: 71-year-old asymptomatic male current smoker with 72
pack-year smoking history.



[ct lung segmentation data · axial · 0.76mm/px · z∈[+61,+61]mm · 10 of 317 frames shown]
[frame 1/317  mediastinal]
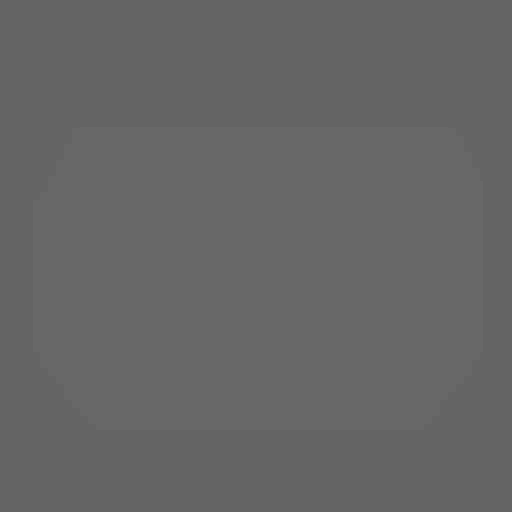
[frame 1/317  lung]
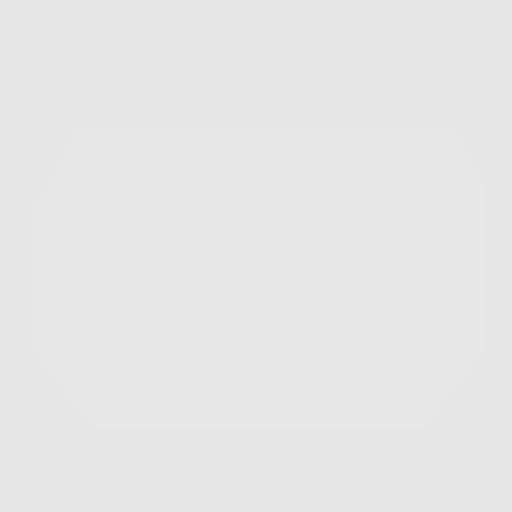
[frame 36/317  lung]
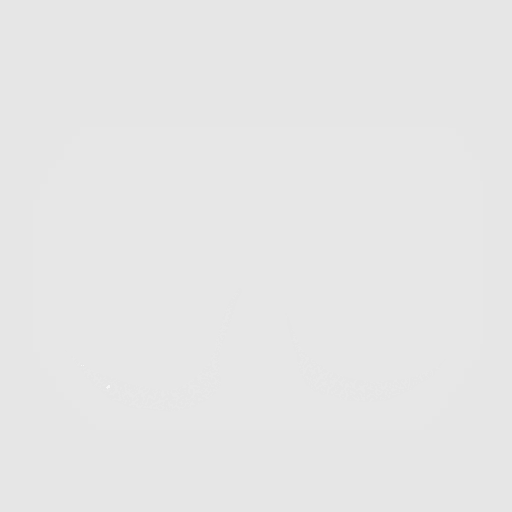
[frame 71/317  lung]
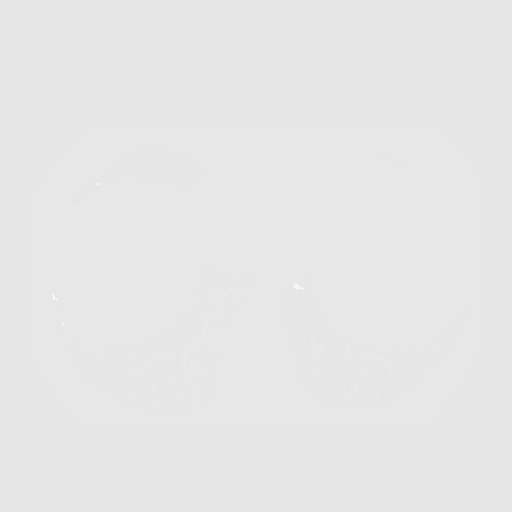
[frame 106/317  lung]
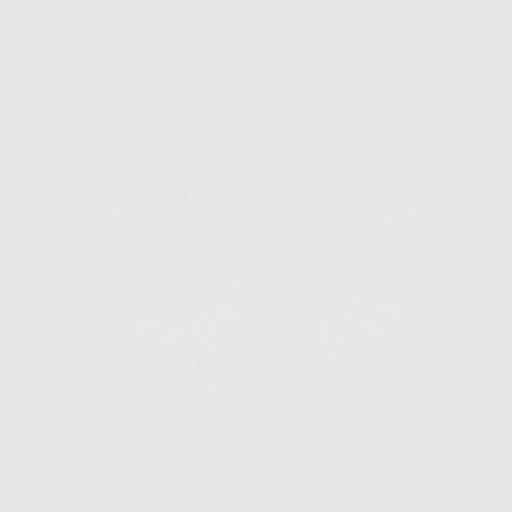
[frame 141/317  mediastinal]
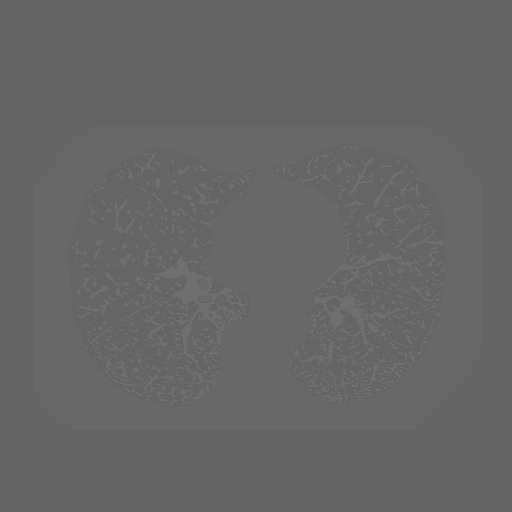
[frame 141/317  lung]
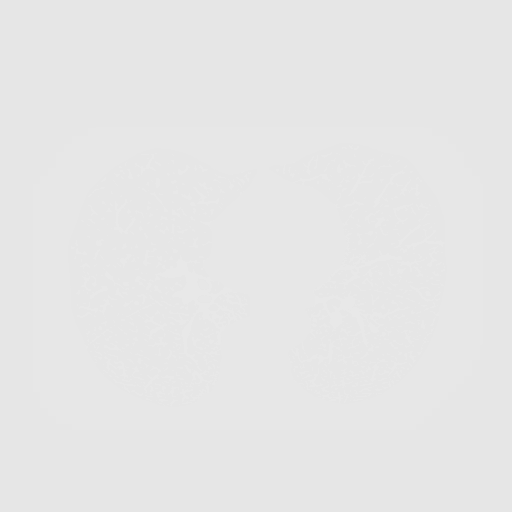
[frame 176/317  lung]
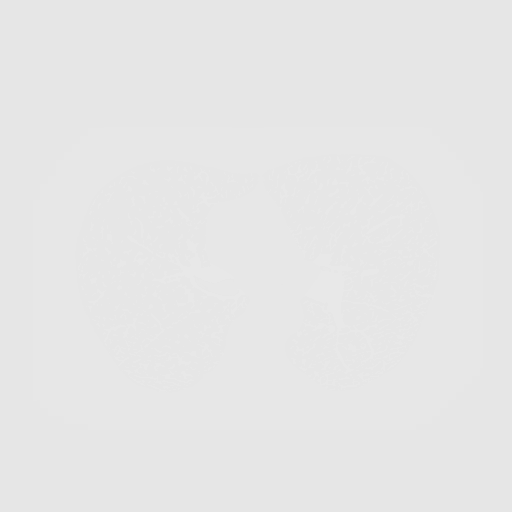
[frame 211/317  lung]
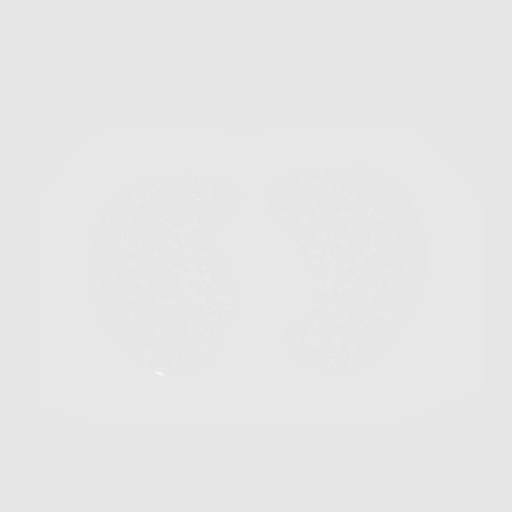
[frame 246/317  lung]
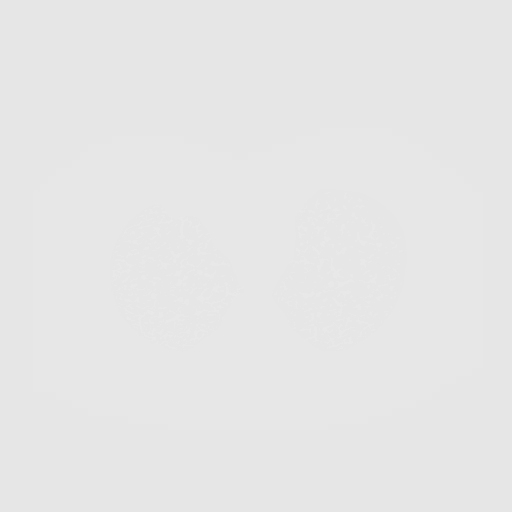
[frame 281/317  mediastinal]
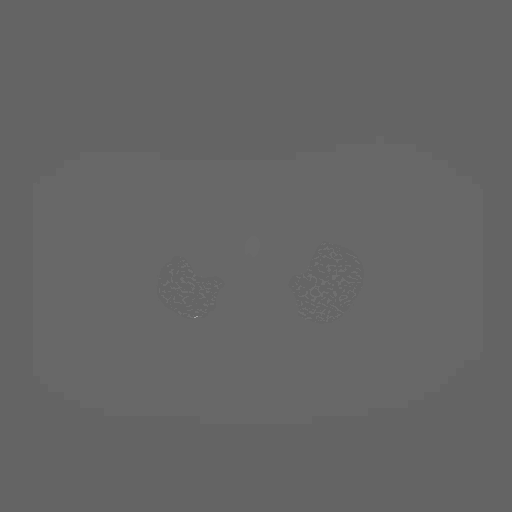
[frame 281/317  lung]
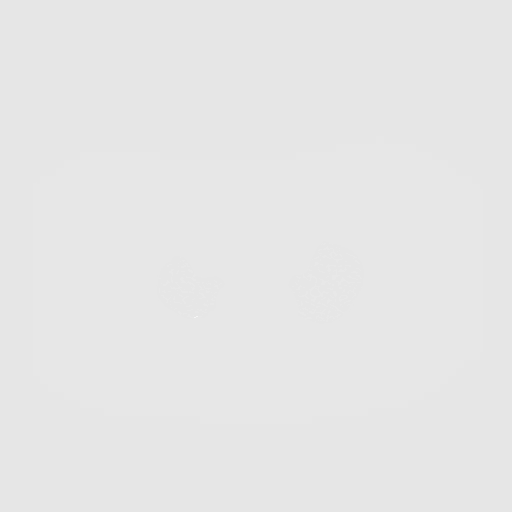
[frame 317/317  lung]
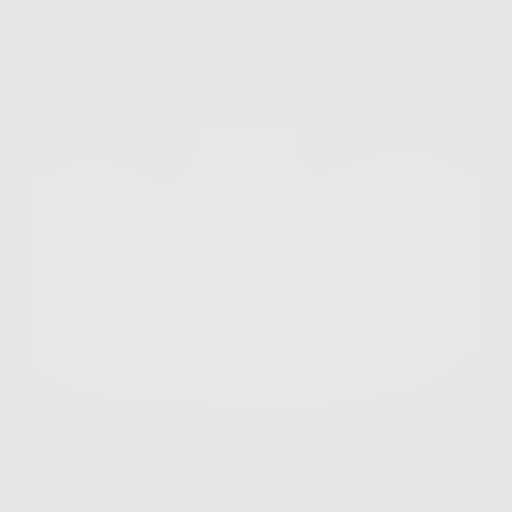

[10 of 10 positions shown; findings below may reference images not displayed]

FINDINGS: Cardiovascular: Normal heart size. No significant pericardial
effusion/thickening. Three-vessel coronary atherosclerosis.
Atherosclerotic nonaneurysmal thoracic aorta. Normal caliber
pulmonary arteries.

Mediastinum/Nodes: Mildly enlarged thyroid without discrete thyroid
nodules. Unremarkable esophagus. No pathologically enlarged
axillary, mediastinal or hilar lymph nodes, noting limited
sensitivity for the detection of hilar adenopathy on this
noncontrast study.

Lungs/Pleura: No pneumothorax. No pleural effusion. Mild
centrilobular emphysema with diffuse bronchial wall thickening. No
acute consolidative airspace disease or lung masses. Solid
subpleural superior segment left lower lobe pulmonary nodule
measuring 5.2 mm in volume derived mean diameter (series 3/image
92). No additional significant pulmonary nodules. Calcified
subcentimeter right upper lobe granuloma.

Upper abdomen: No acute abnormality.

Musculoskeletal: No aggressive appearing focal osseous lesions.
Marked thoracic spondylosis.
IMPRESSION: 1. Lung-RADS 2, benign appearance or behavior. Continue annual
screening with low-dose chest CT without contrast in 12 months.
2. Three-vessel coronary atherosclerosis.
3. Aortic Atherosclerosis ([id number]-NU1.1) and Emphysema ([id number].B).

## 2023-07-22 DIAGNOSIS — J439 Emphysema, unspecified: Secondary | ICD-10-CM | POA: Diagnosis not present

## 2023-07-22 DIAGNOSIS — E1169 Type 2 diabetes mellitus with other specified complication: Secondary | ICD-10-CM | POA: Diagnosis not present

## 2023-07-22 DIAGNOSIS — I10 Essential (primary) hypertension: Secondary | ICD-10-CM | POA: Diagnosis not present

## 2023-07-22 DIAGNOSIS — E782 Mixed hyperlipidemia: Secondary | ICD-10-CM | POA: Diagnosis not present

## 2023-07-22 DIAGNOSIS — Z0000 Encounter for general adult medical examination without abnormal findings: Secondary | ICD-10-CM | POA: Diagnosis not present

## 2023-07-22 DIAGNOSIS — F17200 Nicotine dependence, unspecified, uncomplicated: Secondary | ICD-10-CM | POA: Diagnosis not present

## 2023-07-22 DIAGNOSIS — E039 Hypothyroidism, unspecified: Secondary | ICD-10-CM | POA: Diagnosis not present

## 2023-07-22 DIAGNOSIS — Z8051 Family history of malignant neoplasm of kidney: Secondary | ICD-10-CM | POA: Diagnosis not present

## 2023-08-15 DIAGNOSIS — Z8051 Family history of malignant neoplasm of kidney: Secondary | ICD-10-CM | POA: Diagnosis not present

## 2023-09-17 DIAGNOSIS — H43813 Vitreous degeneration, bilateral: Secondary | ICD-10-CM | POA: Diagnosis not present

## 2023-09-17 DIAGNOSIS — H353132 Nonexudative age-related macular degeneration, bilateral, intermediate dry stage: Secondary | ICD-10-CM | POA: Diagnosis not present

## 2023-09-17 DIAGNOSIS — H52203 Unspecified astigmatism, bilateral: Secondary | ICD-10-CM | POA: Diagnosis not present

## 2023-09-17 DIAGNOSIS — E119 Type 2 diabetes mellitus without complications: Secondary | ICD-10-CM | POA: Diagnosis not present

## 2023-09-17 DIAGNOSIS — H40023 Open angle with borderline findings, high risk, bilateral: Secondary | ICD-10-CM | POA: Diagnosis not present

## 2023-09-17 DIAGNOSIS — H26491 Other secondary cataract, right eye: Secondary | ICD-10-CM | POA: Diagnosis not present

## 2023-09-17 DIAGNOSIS — H524 Presbyopia: Secondary | ICD-10-CM | POA: Diagnosis not present

## 2023-11-04 DIAGNOSIS — N183 Chronic kidney disease, stage 3 unspecified: Secondary | ICD-10-CM | POA: Diagnosis not present

## 2024-01-26 ENCOUNTER — Other Ambulatory Visit: Payer: Self-pay | Admitting: Family Medicine

## 2024-01-26 DIAGNOSIS — M4800 Spinal stenosis, site unspecified: Secondary | ICD-10-CM

## 2024-02-12 ENCOUNTER — Other Ambulatory Visit

## 2024-02-24 ENCOUNTER — Other Ambulatory Visit

## 2024-02-26 ENCOUNTER — Other Ambulatory Visit

## 2024-02-27 ENCOUNTER — Ambulatory Visit (HOSPITAL_BASED_OUTPATIENT_CLINIC_OR_DEPARTMENT_OTHER)
